# Patient Record
Sex: Male | Born: 1990 | Race: Black or African American | Hispanic: No | Marital: Single | Smoking: Never smoker
Health system: Southern US, Community
[De-identification: ages and names within clinical notes are randomized; demographics above are authoritative.]

## PROBLEM LIST (undated history)

## (undated) HISTORY — PX: TONSILLECTOMY: SUR1361

---
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
## 2011-03-31 LAB — COMPREHENSIVE METABOLIC PANEL
ALT: 19
Albumin: 4.5
BUN: 12
CO2: 29
Calcium: 9.7
Potassium: 3.6
Sodium: 138

## 2011-03-31 LAB — DRUGS OF ABUSE SCREEN W/O ALC, ROUTINE URINE
Amphetamine Screen, Ur: NEGATIVE
Barbiturate Quant, Ur: NEGATIVE
Benzodiazepines.: NEGATIVE
Cocaine Metabolites: NEGATIVE
Creatinine,U: 204.3
Marijuana Metabolite: NEGATIVE
Propoxyphene: NEGATIVE

## 2011-03-31 LAB — CBC
Hemoglobin: 13.8
MCHC: 33.4
MCV: 87.7
Platelets: 204
RBC: 4.71
RDW: 12.8

## 2011-03-31 LAB — URINALYSIS, ROUTINE W REFLEX MICROSCOPIC
Glucose, UA: NEGATIVE
Hgb urine dipstick: NEGATIVE
Ketones, ur: NEGATIVE
Protein, ur: NEGATIVE
Specific Gravity, Urine: 1.021
pH: 6.5

## 2011-03-31 LAB — DIFFERENTIAL
Basophils Relative: 0
Eosinophils Absolute: 0.2
Eosinophils Relative: 2
Neutro Abs: 8.4 — ABNORMAL HIGH
Neutrophils Relative %: 73 — ABNORMAL HIGH

## 2011-03-31 LAB — GC/CHLAMYDIA PROBE AMP, URINE: GC Probe Amp, Urine: NEGATIVE

## 2011-03-31 LAB — T4, FREE: Free T4: 1.16

## 2012-06-01 ENCOUNTER — Emergency Department (HOSPITAL_COMMUNITY): Payer: Self-pay

## 2012-06-01 ENCOUNTER — Encounter (HOSPITAL_COMMUNITY): Payer: Self-pay | Admitting: *Deleted

## 2012-06-01 ENCOUNTER — Emergency Department (HOSPITAL_COMMUNITY)
Admission: EM | Admit: 2012-06-01 | Discharge: 2012-06-01 | Disposition: A | Discharge status: Home / Self Care | Payer: Self-pay | Attending: Emergency Medicine | Admitting: Emergency Medicine

## 2012-06-01 DIAGNOSIS — Y9389 Activity, other specified: Secondary | ICD-10-CM | POA: Insufficient documentation

## 2012-06-01 DIAGNOSIS — R42 Dizziness and giddiness: Secondary | ICD-10-CM | POA: Insufficient documentation

## 2012-06-01 DIAGNOSIS — S0993XA Unspecified injury of face, initial encounter: Secondary | ICD-10-CM | POA: Insufficient documentation

## 2012-06-01 DIAGNOSIS — W2209XA Striking against other stationary object, initial encounter: Secondary | ICD-10-CM | POA: Insufficient documentation

## 2012-06-01 DIAGNOSIS — S0990XA Unspecified injury of head, initial encounter: Secondary | ICD-10-CM

## 2012-06-01 DIAGNOSIS — Y929 Unspecified place or not applicable: Secondary | ICD-10-CM | POA: Insufficient documentation

## 2012-06-01 DIAGNOSIS — S199XXA Unspecified injury of neck, initial encounter: Secondary | ICD-10-CM | POA: Insufficient documentation

## 2012-06-01 MED ORDER — HYDROCODONE-ACETAMINOPHEN 5-325 MG PO TABS: 1.0000 | ORAL_TABLET | Freq: Once | ORAL | Status: DC

## 2012-06-01 MED ORDER — NAPROXEN 375 MG PO TABS: 375.0000 mg | ORAL_TABLET | Freq: Two times a day (BID) | ORAL | Status: AC

## 2012-06-01 NOTE — ED Notes (Signed)
Pt left without being discharged by nurse
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
## 2012-06-01 NOTE — ED Notes (Signed)
Patient currently sitting up in bed; no respiratory or acute distress noted.  Patient updated on plan of care; informed patient that we are currently waiting on CT results.  Patient denies any other needs at this time; will continue to monitor.

## 2012-06-01 NOTE — ED Notes (Signed)
Went in to medicate and discharge patient; patient not present in room.  Will check again.
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
## 2012-06-01 NOTE — ED Notes (Signed)
Patient left without signing out/discharge instructions

## 2012-11-21 ENCOUNTER — Emergency Department (HOSPITAL_COMMUNITY)
Admission: EM | Admit: 2012-11-21 | Discharge: 2012-11-22 | Discharge status: Against Medical Advice | Payer: Self-pay | Attending: Emergency Medicine | Admitting: Emergency Medicine

## 2012-11-21 DIAGNOSIS — S298XXA Other specified injuries of thorax, initial encounter: Secondary | ICD-10-CM | POA: Insufficient documentation

## 2012-11-21 DIAGNOSIS — S0993XA Unspecified injury of face, initial encounter: Secondary | ICD-10-CM | POA: Insufficient documentation

## 2012-11-21 DIAGNOSIS — S199XXA Unspecified injury of neck, initial encounter: Secondary | ICD-10-CM | POA: Insufficient documentation

## 2012-11-21 DIAGNOSIS — R42 Dizziness and giddiness: Secondary | ICD-10-CM | POA: Insufficient documentation

## 2012-11-21 DIAGNOSIS — S3981XA Other specified injuries of abdomen, initial encounter: Secondary | ICD-10-CM | POA: Insufficient documentation

## 2012-11-22 ENCOUNTER — Encounter (HOSPITAL_COMMUNITY): Payer: Self-pay | Admitting: Emergency Medicine
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
## 2012-11-22 NOTE — ED Notes (Signed)
Pt states he does not want to be here and didn't want to come in the first place. Pt. wants to leave AMA. Pt was explained risks and benefits. Pt left ambulatory with family at side. Nad.

## 2013-09-30 IMAGING — CT CT HEAD W/O CM
1 of 2 series · 16 of 30 positions shown, 20 images · non-contrast
Comparison: 11/11/2004

CLINICAL DATA: Head trauma, dizziness

CT HEAD WITHOUT CONTRAST
TECHNIQUE: Contiguous axial images were obtained from the base of
the skull through the vertex without contrast.

[Series 3: recon 2: brain · axial · 0.47mm/px · z∈[+154,+294]mm · 16 of 64 slices shown, 20 images]
[im 4/64  brain]
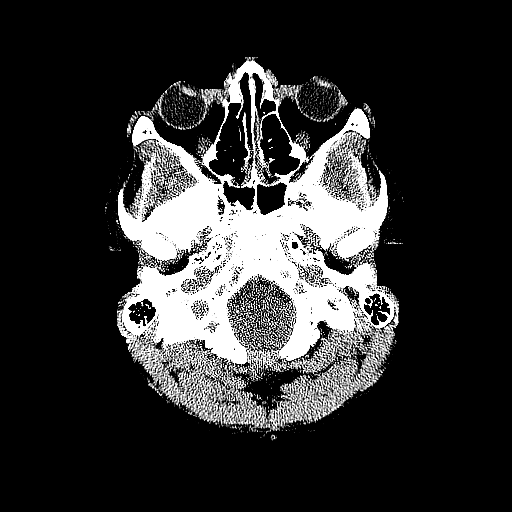
[im 4/64  bone]
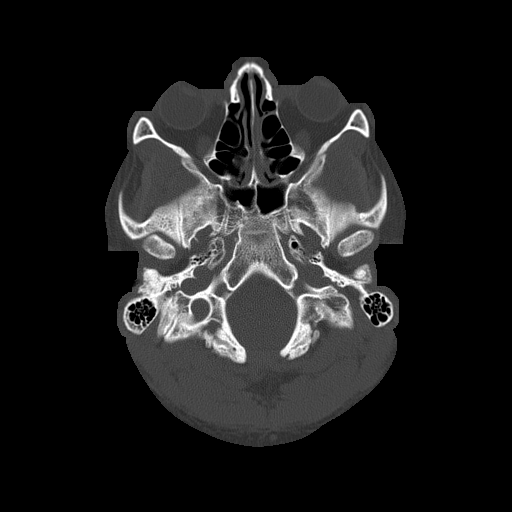
[im 7/64  brain]
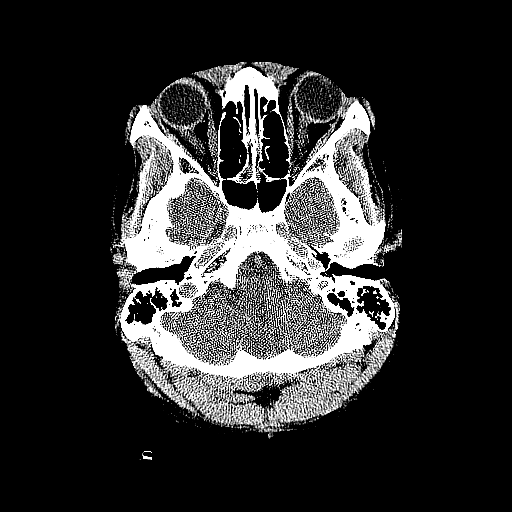
[im 10/64  brain]
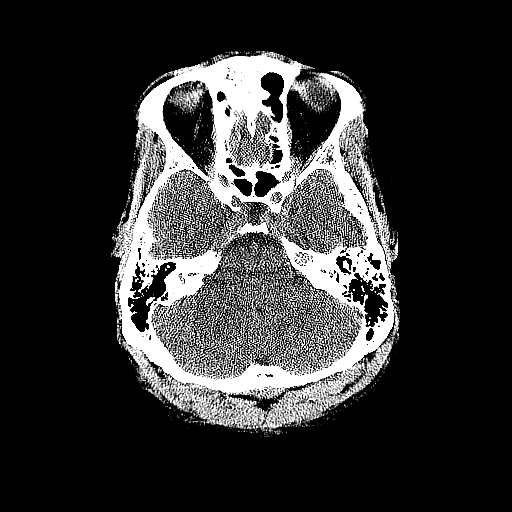
[im 14/64  brain]
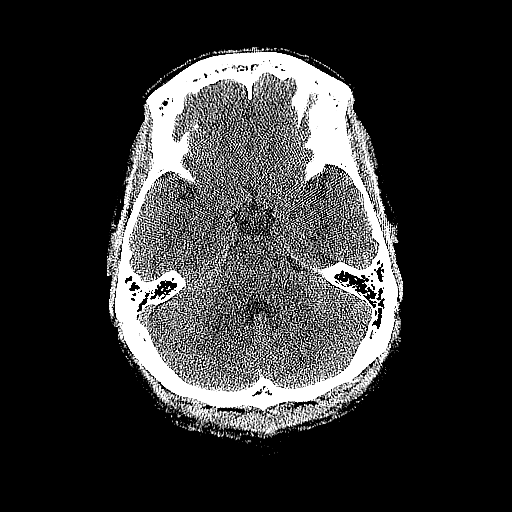
[im 20/64  brain]
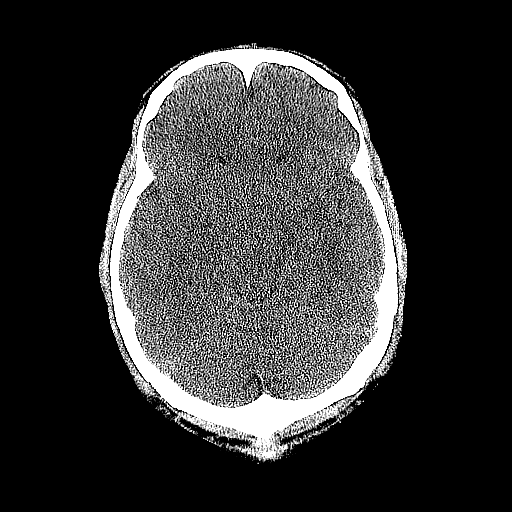
[im 20/64  bone]
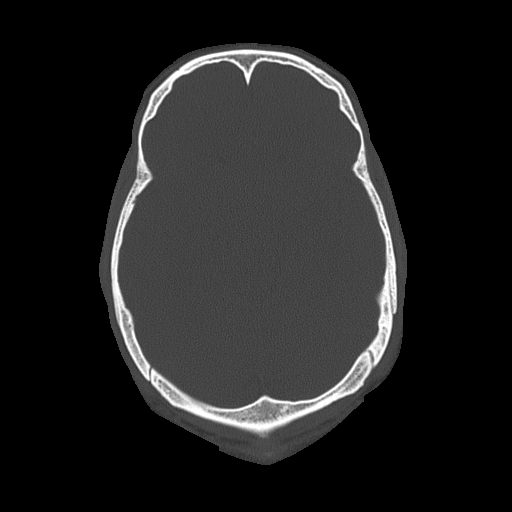
[im 24/64  brain]
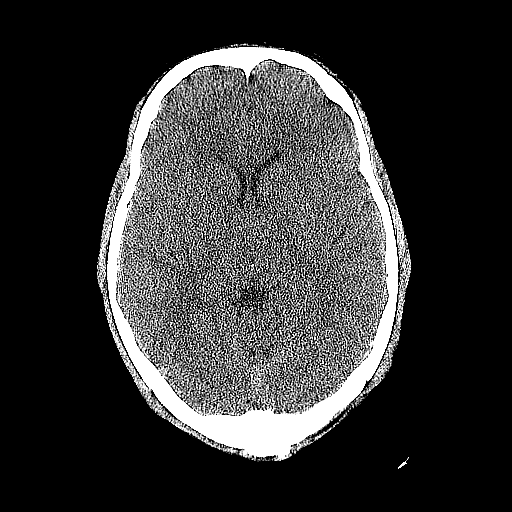
[im 27/64  brain]
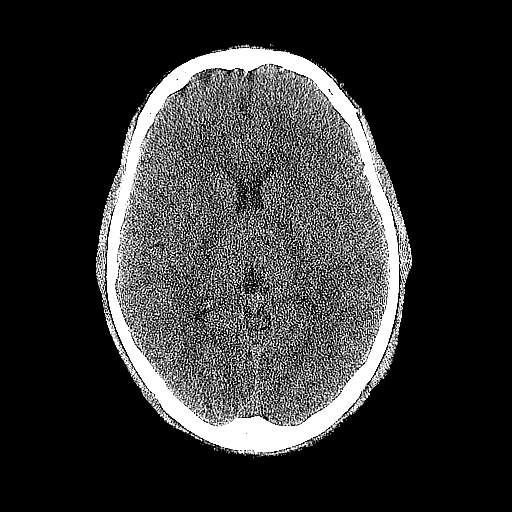
[im 30/64  brain]
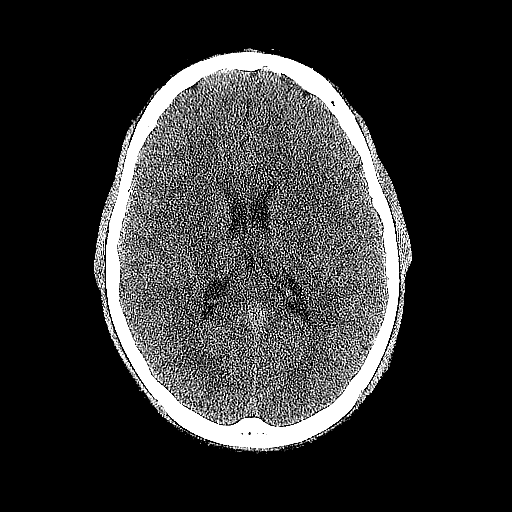
[im 34/64  brain]
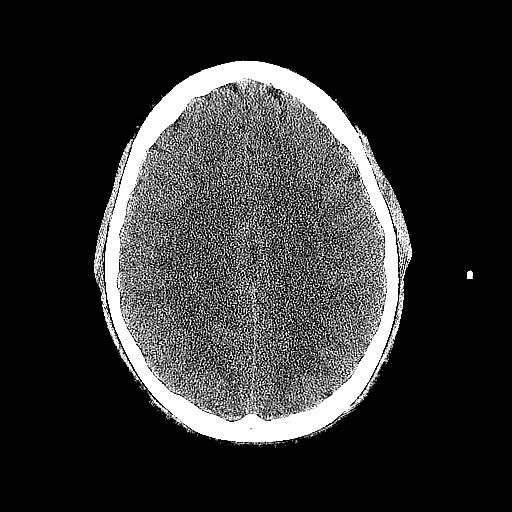
[im 34/64  bone]
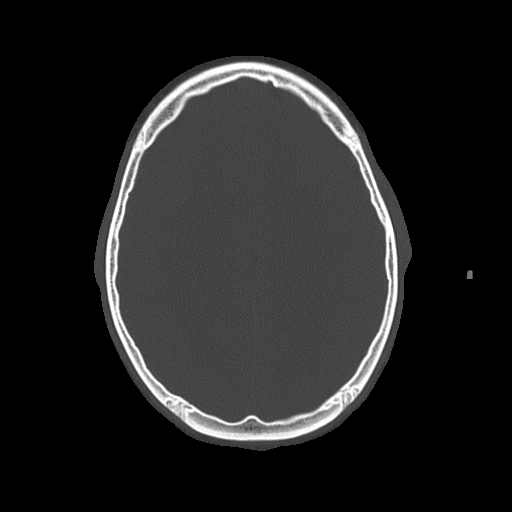
[im 37/64  brain]
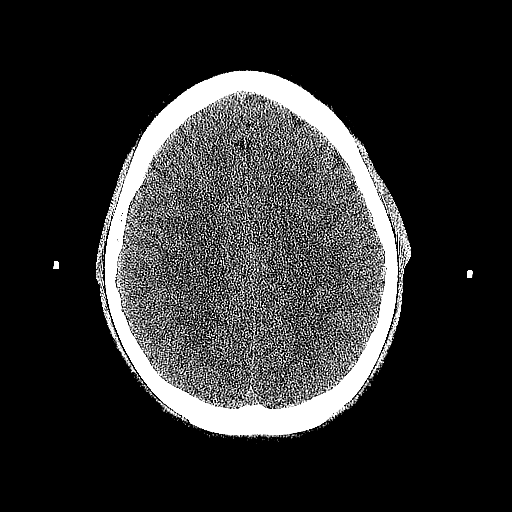
[im 40/64  brain]
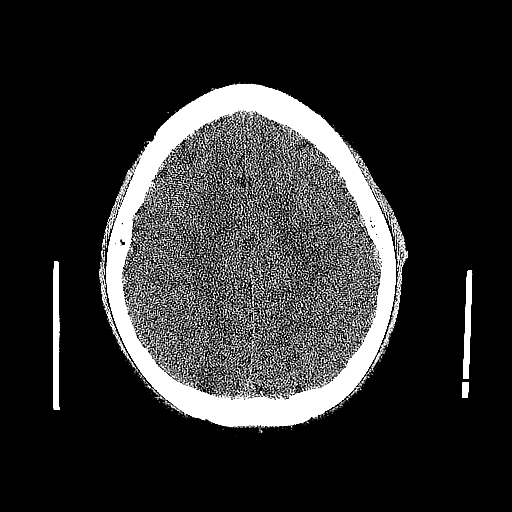
[im 44/64  brain]
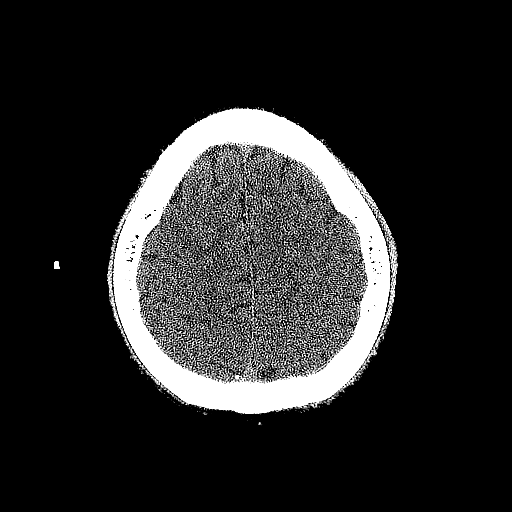
[im 50/64  brain]
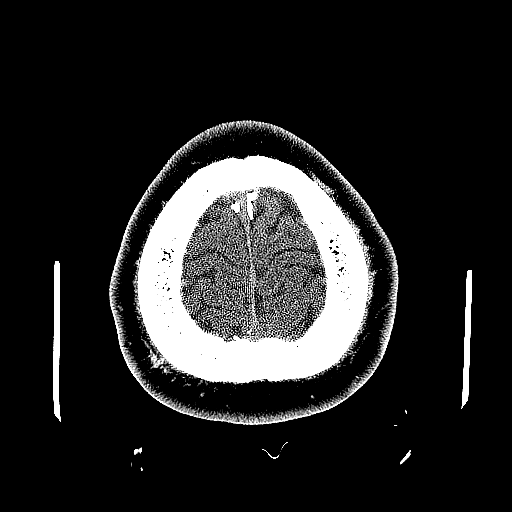
[im 50/64  bone]
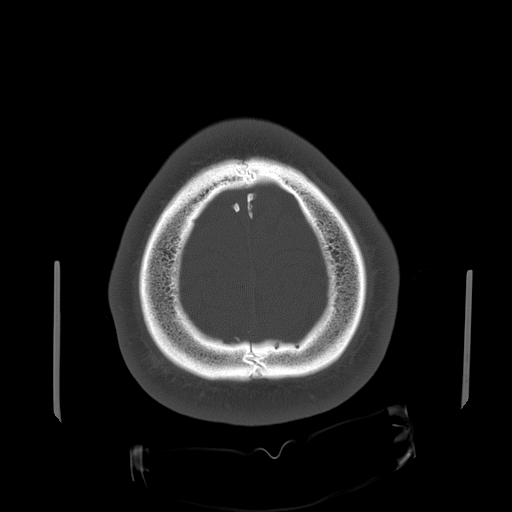
[im 54/64  brain]
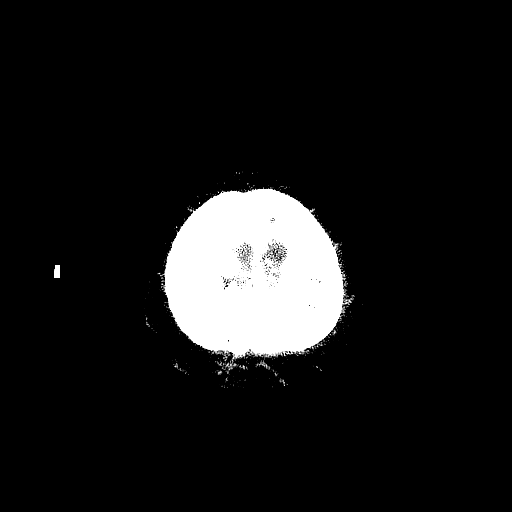
[im 57/64  brain]
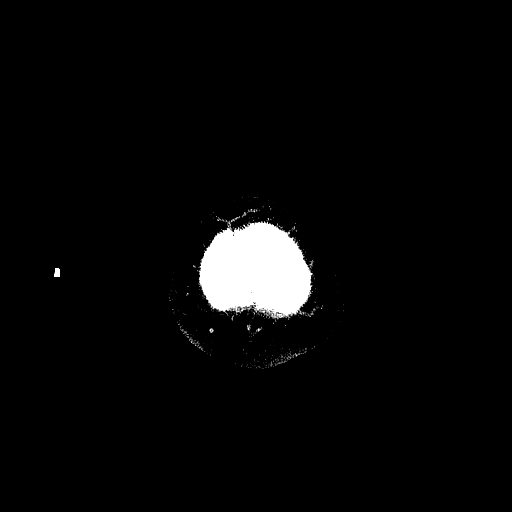
[im 60/64  brain]
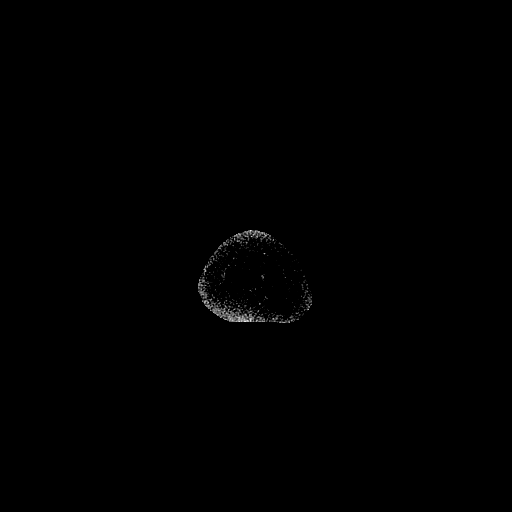

[16 of 30 positions shown; findings below may reference images not displayed]

FINDINGS: There is no evidence for acute hemorrhage, hydrocephalus,
mass lesion, or abnormal extra-axial fluid collection.  No definite
CT evidence for acute infarction.  The visualized paranasal sinuses
and mastoid air cells are predominately clear.  No displaced
calvarial fracture. Left lateral scalp swelling.
IMPRESSION: No acute intracranial abnormality.

## 2014-06-25 ENCOUNTER — Emergency Department (HOSPITAL_COMMUNITY)
Admission: EM | Admit: 2014-06-25 | Discharge: 2014-06-25 | Disposition: A | Discharge status: Home / Self Care | Payer: Self-pay | Attending: Emergency Medicine | Admitting: Emergency Medicine

## 2014-06-25 ENCOUNTER — Encounter (HOSPITAL_COMMUNITY): Payer: Self-pay | Admitting: Emergency Medicine

## 2014-06-25 DIAGNOSIS — Z791 Long term (current) use of non-steroidal anti-inflammatories (NSAID): Secondary | ICD-10-CM | POA: Insufficient documentation

## 2014-06-25 DIAGNOSIS — R011 Cardiac murmur, unspecified: Secondary | ICD-10-CM | POA: Insufficient documentation

## 2014-06-25 NOTE — Discharge Instructions (Signed)
Please call your doctor for a followup appointment within 24-48 hours. When you talk to your doctor please let them know that you were seen in the emergency department and have them acquire all of your records so that they can discuss the findings with you and formulate a treatment plan to fully care for your new and ongoing problems. Please follow up with cardiologist and Urgent care center Please continue to monitor symptoms closely and if symptoms are to worsen or change (fever greater than 101, chills, sweating, nausea, vomiting, chest pain, shortness of breathe, difficulty breathing, weakness, numbness, tingling, worsening or changes to pain pattern, fall, injury, dizziness, fainting, weakness, visual changes) please report back to the Emergency Department immediately.    Emergency Department Resource Guide 1) Find a Doctor and Pay Out of Pocket Although you won't have to find out who is covered by your insurance plan, it is a good idea to ask around and get recommendations. You will then need to call the office and see if the doctor you have chosen will accept you as a new patient and what types of options they offer for patients who are self-pay. Some doctors offer discounts or will set up payment plans for their patients who do not have insurance, but you will need to ask so you aren't surprised when you get to your appointment.  2) Contact Your Local Health Department Not all health departments have doctors that can see patients for sick visits, but many do, so it is worth a call to see if yours does. If you don't know where your local health department is, you can check in your phone book. The CDC also has a tool to help you locate your state's health department, and many state websites also have listings of all of their local health departments.  3) Find a Walk-in Clinic If your illness is not likely to be very severe or complicated, you may want to try a walk in clinic. These are popping up  all over the country in pharmacies, drugstores, and shopping centers. They're usually staffed by nurse practitioners or physician assistants that have been trained to treat common illnesses and complaints. They're usually fairly quick and inexpensive. However, if you have serious medical issues or chronic medical problems, these are probably not your best option.  No Primary Care Doctor: - Call Health Connect at  [phone] - they can help you locate a primary care doctor that  accepts your insurance, provides certain services, etc. - Physician Referral Service- [phone]  Chronic Pain Problems: Organization         Address  Phone   Notes  [facility]  [phone] Patients need to be referred by their primary care doctor.   Medication Assistance: Organization         Address  Phone   Notes  [facility] [address] [place], [place] [phone] --Must be a resident of [facility] [place] [facility] [place] -- Must have NO insurance coverage whatsoever (no Medicaid/ Medicare, etc.) -- The pt. MUST have a primary care doctor that directs their care regularly and follows them in the community   Med[facility]ist  [phone]   [employer]  [phone]) [phone]    Agencies that provide inexpensive medical care: Organization         Address  Phone   Notes  [facility]  [phone]   [facility]    [phone]  [facility] [address] [place], [place] [address] [phone]   [facility] [address] [place], [place] [phone]   Planned Parenthood    [phone]   [facility]    [phone]   [facility] and [facility]  [address], [place] Phone:  [phone], Fax:  [phone] Hours of Operation:  9 am - 6 pm, M-F.  Also accepts Medicaid/Medicare and self-pay.  [facility]  [address], [place] Phone: [phone], Fax: [phone]. Hours of Operation:  8:30 am - 5:30 pm, M-F.  Also accepts Medicaid and self-pay.  [facility] [place] [address] [place] Phone: [phone]   [facility] [address] Natasha Bence [place], [place] [phone], Ext. [phone] Mondays & Thursdays: 7-9 AM.  First 15 patients are seen on a first come, first serve basis.    Medicaid-accepting [facility] Providers:  Organization         Address  Phone   Notes  [facility] [address], [place] [phone] Also accepts self-pay patients.  [facility] [address] Laurell Josephs [place], [place]  [phone]   [facility] [address], [place] [phone]   [facility] [address], [place] [phone]   Renaye Rakers [address], [place]   [phone] Only accepts [place] patients after they have their name applied to their card.   Self-Pay (no insurance) in [facility]:  Organization         Address  Phone   Notes  Sickle Cell Patients, [facility] [address] [place], [place] [phone]   [facility] [address] [place], [place] [phone]   [facility] [place]  [address], [place] [phone]   [facility]/Dr. Osei-Bonsu  [address], [place] or [address], [place] [phone] Phone number for both [place] and [place] locations is the same.  [facility]gent Medical and [facility] [address] [place] [phone]   [facility] [address] [place] or [address] [phone]   [facility] [address], [place] [phone], phone; [phone], fax Sees patients 1st and 3rd Saturday of every  month.  Must not qualify for public or private insurance (i.e. Medicaid, Medicare, [place] Health Choice, Veterans' Benefits)  Household income should be no more than 200% of the poverty level The clinic cannot treat you if you are pregnant or think you are pregnant  Sexually transmitted diseases are not treated at the clinic.    Dental Care: Organization         Address  Phone  Notes  [facility] Department of [facility] [address] [place], [place] [phone] Accepts children up to age 46 who are enrolled in [place] or [place] Health Choice; pregnant women with a Medicaid card; and children who have applied for Medicaid or [place] Health Choice, but were declined, whose parents can pay a reduced fee at time of service.  [facility] of [facility]  [address], [place] [phone] Accepts children up to age 52 who  are enrolled in Medicaid or [place] Health Choice; pregnant women with a Medicaid card; and children who have applied for Medicaid or [place] Health Choice, but were declined, whose parents can pay a reduced fee at time of service.  [facility]  [address], [place] [phone] Patients are seen by appointment only. Walk-ins are not accepted. [facility] will see patients 22 years of age and older. Monday - Tuesday (8am-5pm) Most Wednesdays (8:30-5pm) $30 per visit, cash only  [facility]  [address], [facility] [phone] Patients are seen by appointment only. Walk-ins are not accepted. [facility] will see patients 118 years of age and older. One Wednesday Evening (Monthly: Volunteer Based).  $30 per visit, cash only  [facility]  [phone] for adults; Children under age 564, call Graduate Pediatric Dentistry at [phone]. Children aged 244-14, please call [phone] to request a pediatric application.  Dental  services are provided in all areas of dental care including fillings, crowns and bridges, complete and partial dentures, implants, gum treatment, root canals, and extractions. Preventive care is also provided. Treatment is provided to both adults and children. Patients are selected via a lottery and there is often a waiting list.   [facility] [address], [place]  [phone] [email or web address]   [facility] [address], [place], [place] [phone], Ext. [phone] Second and Fourth Thursday of each month, opens at 6:30 AM; Clinic ends at 9 AM.  Patients are seen on a first-come first-served basis, and a limited number are seen during each clinic.   [facility]  [address], [place], [place] [phone]   Eligibility Requirements You must have lived in [place], [place], or [place] counties for at least the last three months.   You cannot be eligible for state or federal sponsored National Cityhealthcare insurance, including CIGNAVeterans Administration, [place]icaid, or [employer]Medicare.   You generally cannot be eligible for healthcare insurance through your employer.    How to apply: Eligibility screenings are held every Tuesday and Wednesday afternoon from 1:00 pm until 4:00 pm. You do not need an appointment for the interview!  [facility] [address], [place], [place] [phone]   [facility]  [phone]   [facility]  [phone]   [facility]  [phone]    Behavioral Health Resources in the Community: Intensive Outpatient Programs Organization         Address  Phone  Notes  [facility] [address], [place], [place] [phone]   [facility]patient [address], [place], [place] [phone]   [facility] [address], [place], [place]  [phone]   [facility] [address],    [place], [place] [phone] or [phone]   Substance Abuse Resources Organization         Address  Phone  Notes  [facility]  [phone]   [facility]  [phone]   [facility]  [phone]   [facility]  [phone]   Residential & Outpatient Substance Abuse Program  [phone]   Psychological Services Organization         Address  Phone  Notes  [facility]  [phone]- [phone]   [facility]  [phone]- [phone]   [facility] [address]  [address] [place] [phone] or [phone]    Mobile Crisis Teams Organization         Address  Phone  Notes  [facility], Mobile Crisis Care Unit  [phone]   [facility]  [address] [place], [place] [phone]   Doristine Locks [address] [place] [phone]    Self-Help/Support Groups Organization         Address  Phone             Notes  [facility]oc[facility] of [place] - variety of support groups  336- I7437963 Call for more information  Narcotics Anonymous (NA), Caring Services [address], [employer] [place]  2 meetings at this location   Statistician         Address  Phone  Notes  [facility] [address],    [place]  [phone]   [facility]  [address] [place] [address], [place], [place] [phone]   [facility] [address] [place], [place] [phone] Admissions: 8am-3pm M-F  [facility] [address]    [place], [place] [phone]   [facility] [address] [place], [place], [place] [phone]   [facility] [address]  [place], [place] [phone]   [facility] - Intensive Outpatient [address], [place], [place] [phone]   [facility] [address] [place] [address]  [place], [place]  [phone] or [phone]   [facility] ([facility]) [address] [place], [place] [phone] Accepts Medicaid  [facility] [address]  [place] [phone] Substance Abuse/Addiction Treatment   [facility] Organization         Address  Phone  Notes  [facility]  [phone]   Angie Fava, PhD [address] Ervin Knack [place], [place]   [phone] or [phone]   [facility]   [address] [place], [place] [phone]   [facility] [address], [place], [place] [phone] Insurance/Medicaid/sponsorship through [facility] and Families [address]                                    [place], [place] [phone] Therapy/tele-psych/case  [facility] [address], [place] [phone]    Dr. Lolly Mustache  [phone]   [facility] [place]  [employer] [facility]. 1) [address], [place] 2) [address] [place] 3)  [address], [place] [phone]  [phone]   [facility] Child Abuse Hotline 949-053-1996 or [phone] (After Hours)

## 2014-06-25 NOTE — ED Provider Notes (Signed)
CSN: [id number]     Arrival date & time 06/25/14  1418 History  This chart was scribed for non-physician practitioner, Raymon MuttonMarissa Dazia Lippold, PA-C, working with Toy BakerAnthony T Allen, MD, by Abel PrestoKara Demonbreun, ED Scribe. This patient was seen in room WTR5/WTR5 and the patient's care was started at 3:02 PM.    Chief Complaint  Patient presents with  . Heart Murmur    Pt was told that he has a heart murmur. He does not agree    The history is provided by the patient and a relative. No language interpreter was used.   HPI Comments: David Barajas is a 23 y.o. male who presents to the Emergency Department complaining of possible heart murmur. Pt notes he was trying to donate plasma 7 months ago and was told he has a heart murmur.  Pt states that something has come up today and he needs to be tested. Pt denies chest pain, SOB, cough, nasal congestion, dizziness, and no visual disturbances.   History reviewed. No pertinent past medical history. Past Surgical History  Procedure Laterality Date  . Tonsillectomy     Family History  Problem Relation Age of Onset  . Hypertension Mother   . Diabetes Mother    History  Substance Use Topics  . Smoking status: Never Smoker   . Smokeless tobacco: Not on file  . Alcohol Use: No    Review of Systems  HENT: Negative for congestion.   Eyes: Negative for visual disturbance.  Respiratory: Negative for cough and shortness of breath.   Cardiovascular: Negative for chest pain.  Neurological: Negative for dizziness.      Allergies  Review of patient's allergies indicates no known allergies.  Home Medications   Prior to Admission medications   Medication Sig Start Date End Date Taking? Authorizing Provider  naproxen (NAPROSYN) 375 MG tablet Take 1 tablet (375 mg total) by mouth 2 (two) times daily. 06/01/12   Chionesu K Verl BangsSonyika, MD   BP 134/83 mmHg  Pulse 95  Temp(Src) 98 F (36.7 C) (Oral)  Resp 14  SpO2 99% Physical Exam  Constitutional: He is  oriented to person, place, and time. He appears well-developed and well-nourished. No distress.  HENT:  Head: Normocephalic and atraumatic.  Eyes: Conjunctivae and EOM are normal. Right eye exhibits no discharge. Left eye exhibits no discharge.  Neck: Normal range of motion. Neck supple. No tracheal deviation present.  Cardiovascular: Normal rate, regular rhythm and normal heart sounds.  Exam reveals no friction rub.   No murmur heard. Pulses:      Radial pulses are 2+ on the right side, and 2+ on the left side.  Pulmonary/Chest: Effort normal and breath sounds normal. No respiratory distress. He has no wheezes. He has no rales.  Musculoskeletal: Normal range of motion.  Full ROM to upper and lower extremities without difficulty noted, negative ataxia noted.  Lymphadenopathy:    He has no cervical adenopathy.  Neurological: He is alert and oriented to person, place, and time. No cranial nerve deficit. He exhibits normal muscle tone. Coordination normal.  Skin: Skin is warm and dry. No rash noted. He is not diaphoretic. No erythema.  Psychiatric: He has a normal mood and affect. His behavior is normal. Thought content normal.  Nursing note and vitals reviewed.   ED Course  Procedures (including critical care time) DIAGNOSTIC STUDIES: Oxygen Saturation is 99% on room air, normal by my interpretation.    COORDINATION OF CARE: 3:17 PM Discussed treatment plan with patient at beside,  the patient agrees with the plan and has no further questions at this time.   Labs Review Labs Reviewed - No data to display  Imaging Review No results found.   EKG Interpretation None      MDM   Final diagnoses:  Heart murmur    Medications - No data to display  Filed Vitals:   06/25/14 1428  BP: 134/83  Pulse: 95  Temp: 98 F (36.7 C)  TempSrc: Oral  Resp: 14  SpO2: 99%   I personally performed the services described in this documentation, which was scribed in my presence. The  recorded information has been reviewed and is accurate.  Patient presenting to the ED regarding a possible heart murmur that he was diagnosed with 7 months ago when he was trying to donate plasma. Patient reported that he wants to donate plasma again today and stated that he once to make sure he does not have a heart murmur. Auscultation clear-no signs of murmur heard during examination. Discussed with mother and patient that heart murmur knees to be diagnosed over a Holter monitor and use to be followed up with a cardiologist. Educated patient and mother on what a heart murmur is and how it is diagnosed and that a heart murmur can be present at certain times. Patient became irritated and was not happy with this answer. Mother reported that she will take him elsewhere to be assessed. This provider reported that patient cannot be medically cleared in the ED for plasma to be given-mother was not happy about this answer. Denied chest pain, shortness of breath, difficulty breathing, dizziness, syncope. Benign physical exam. Patient stable, afebrile. Patient septic appearing. Discharged patient. Discussed with patient to closely monitor symptoms and if symptoms are to worsen or change to report back to the ED - strict return instructions given.  Patient agreed to plan of care, understood, all questions answered.   Raymon MuttonMarissa Shaden Lacher, PA-C 06/25/14 1529  Toy BakerAnthony T Allen, MD 06/26/14 [phone]

## 2014-06-25 NOTE — ED Notes (Signed)
Pt was told he had a heart murmur a few months ago. Pt was trying to donate plasma and was told that he had a murmur and needed be seen by a doctor to clear him for donation. Denies pain
